# Patient Record
Sex: Male | Born: 2000 | Hispanic: No | Marital: Single | Smoking: Current some day smoker
Health system: Southern US, Community
[De-identification: ages and names within clinical notes are randomized; demographics above are authoritative.]

---
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
## 2021-07-13 ENCOUNTER — Encounter (HOSPITAL_COMMUNITY): Payer: Self-pay

## 2021-07-13 ENCOUNTER — Other Ambulatory Visit: Payer: Self-pay

## 2021-07-13 ENCOUNTER — Ambulatory Visit (INDEPENDENT_AMBULATORY_CARE_PROVIDER_SITE_OTHER): Payer: Self-pay

## 2021-07-13 ENCOUNTER — Ambulatory Visit (HOSPITAL_COMMUNITY)
Admission: EM | Admit: 2021-07-13 | Discharge: 2021-07-13 | Disposition: A | Discharge status: Home / Self Care | Payer: Self-pay | Attending: Physician Assistant | Admitting: Physician Assistant

## 2021-07-13 DIAGNOSIS — T148XXA Other injury of unspecified body region, initial encounter: Secondary | ICD-10-CM

## 2021-07-13 DIAGNOSIS — M25562 Pain in left knee: Secondary | ICD-10-CM

## 2021-07-13 DIAGNOSIS — Z23 Encounter for immunization: Secondary | ICD-10-CM

## 2021-07-13 DIAGNOSIS — S80212A Abrasion, left knee, initial encounter: Secondary | ICD-10-CM

## 2021-07-13 DIAGNOSIS — S8992XA Unspecified injury of left lower leg, initial encounter: Secondary | ICD-10-CM

## 2021-07-13 DIAGNOSIS — S8002XA Contusion of left knee, initial encounter: Secondary | ICD-10-CM

## 2021-07-13 DIAGNOSIS — S8702XA Crushing injury of left knee, initial encounter: Secondary | ICD-10-CM

## 2021-07-13 MED ORDER — TETANUS-DIPHTH-ACELL PERTUSSIS 5-2.5-18.5 LF-MCG/0.5 IM SUSY
PREFILLED_SYRINGE | INTRAMUSCULAR | Status: AC
  Filled 2021-07-13: qty 0.5

## 2021-07-13 MED ORDER — NAPROXEN 500 MG PO TABS: 500.0000 mg | ORAL_TABLET | Freq: Two times a day (BID) | ORAL | 0 refills | Status: DC

## 2021-07-13 MED ORDER — TETANUS-DIPHTH-ACELL PERTUSSIS 5-2.5-18.5 LF-MCG/0.5 IM SUSY
0.5000 mL | PREFILLED_SYRINGE | Freq: Once | INTRAMUSCULAR | Status: AC
  Administered 2021-07-13: 0.5 mL via INTRAMUSCULAR

## 2021-07-13 NOTE — ED Triage Notes (Signed)
Pt was working on a car when the lift broke and the car fell on his knee.  ?Last TDAP: Unknown ?

## 2021-07-13 NOTE — ED Provider Notes (Signed)
?[facility] ? ? ? ?CSN: [id number] ?Arrival date & time: 07/13/21  3299 ? ? ?  ? ?History   ?Chief Complaint ?Chief Complaint  ?Patient presents with  ? Knee Injury  ? ? ?HPI ?Edward Mcmillan is a 21 y.o. male.  ? ?Patient presents today following injury that occurred yesterday (07/12/2021).  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  Since that time he has had ongoing pain.  He reports pain is worse with certain movements but he is able to bear weight.  He denies any previous injury or surgery involving this knee.  He denies any numbness, paresthesias of the foot.  He does have a small abrasion on the lateral portion of his left knee is unsure when his last tetanus was. ? ? ?History reviewed. No pertinent past medical history. ? ?Patient Active Problem List  ? Diagnosis Date Noted  ? Enthesitis 06/02/2015  ? Obesity 06/02/2015  ? Acne 10/27/2014  ? Keratosis pilaris 03/11/2013  ? ? ?History reviewed. No pertinent surgical history. ? ? ? ? ?Home Medications   ? ?Prior to Admission medications   ?Medication Sig Start Date End Date Taking? Authorizing Provider  ?naproxen (NAPROSYN) 500 MG tablet Take 1 tablet (500 mg total) by mouth 2 (two) times daily with a meal. 07/13/21  Yes Mariya Mottley K, PA-C  ?azelaic acid (AZELEX) 20 % cream Apply topically daily. ?Patient not taking: Reported on 06/03/2017 06/02/15   Theadore Nan, MD  ? ? ?Family History ?Family History  ?Problem Relation Age of Onset  ? Healthy Mother   ? Healthy Father   ? ? ?Social History ?Social History  ? ?Tobacco Use  ? Smoking status: Some Days  ? Smokeless tobacco: Never  ?Substance Use Topics  ? Alcohol use: Not Currently  ? Drug use: Not Currently  ? ? ? ?Allergies   ?Patient has no known allergies. ? ? ?Review of Systems ?Review of Systems  ?Constitutional:  Positive for activity change. Negative for appetite change, fatigue and fever.  ?Musculoskeletal:  Positive for  arthralgias, gait problem and joint swelling. Negative for myalgias.  ?Neurological:  Negative for weakness and numbness.  ? ? ?Physical Exam ?Triage Vital Signs ?ED Triage Vitals [07/13/21 1048]  ?Enc Vitals Group  ?   BP 110/63  ?   Pulse Rate 72  ?   Resp 18  ?   Temp 98.4 ?F (36.9 ?C)  ?   Temp Source Oral  ?   SpO2 100 %  ?   Weight   ?   Height   ?   Head Circumference   ?   Peak Flow   ?   Pain Score   ?   Pain Loc   ?   Pain Edu?   ?   Excl. in GC?   ? ?No data found. ? ?Updated Vital Signs ?BP 110/63 (BP Location: Left Arm)   Pulse 72   Temp 98.4 ?F (36.9 ?C) (Oral)   Resp 18   SpO2 100%  ? ?Visual Acuity ?Right Eye Distance:   ?Left Eye Distance:   ?Bilateral Distance:   ? ?Right Eye Near:   ?Left Eye Near:    ?Bilateral Near:    ? ?Physical Exam ?Vitals reviewed.  ?Constitutional:   ?   General: He is awake.  ?   Appearance: Normal appearance. He is well-developed. He is not ill-appearing.  ?  Comments: Very pleasant male appears stated age in no acute distress sitting comfortably in exam room  ?HENT:  ?   Head: Normocephalic and atraumatic.  ?Cardiovascular:  ?   Rate and Rhythm: Normal rate and regular rhythm.  ?   Pulses:     ?     Posterior tibial pulses are 2+ on the left side.  ?   Heart sounds: Normal heart sounds, S1 normal and S2 normal. No murmur heard. ?Pulmonary:  ?   Effort: Pulmonary effort is normal.  ?   Breath sounds: Normal breath sounds. No stridor. No wheezing, rhonchi or rales.  ?   Comments: Clear to auscultation bilaterally ?Musculoskeletal:  ?   Left knee: Bony tenderness present. No swelling, deformity or effusion. Decreased range of motion. Tenderness present over the lateral joint line. No LCL laxity, MCL laxity, ACL laxity or PCL laxity. ?Skin: ?   Findings: Abrasion present.  ?   Comments: Small abrasion noted lateral left knee.  ?Neurological:  ?   Mental Status: He is alert.  ?Psychiatric:     ?   Behavior: Behavior is cooperative.  ? ? ? ?UC Treatments / Results   ?Labs ?(all labs ordered are listed, but only abnormal results are displayed) ?Labs Reviewed - No data to display ? ?EKG ? ? ?Radiology ?DG Knee Complete 4 Views Left ? ?Result Date: 07/13/2021 ?CLINICAL DATA:  Crush injury yesterday while working on a car, lift broke and car fell on his LEFT knee EXAM: LEFT KNEE - COMPLETE 4+ VIEW COMPARISON:  None FINDINGS: Osseous mineralization normal. Joint spaces preserved. No acute fracture, dislocation, or bone destruction. No joint effusion. IMPRESSION: No acute abnormalities. Electronically Signed   By: Ulyses Southward M.D.   On: 07/13/2021 11:15   ? ?Procedures ?Procedures (including critical care time) ? ?Medications Ordered in UC ?Medications  ?Tdap (BOOSTRIX) injection 0.5 mL (0.5 mLs Intramuscular Given 07/13/21 1105)  ? ? ?Initial Impression / Assessment and Plan / UC Course  ?I have reviewed the triage vital signs and the nursing notes. ? ?Pertinent labs & imaging results that were available during my care of the patient were reviewed by me and considered in my medical decision making (see chart for details). ? ?  ? ?X-ray obtained given mechanism of injury showed no osseous abnormality.  Discussed likely contusion as etiology of symptoms.  Patient was encouraged to use conservative treatment measures including RICE protocol.  He was placed in a brace for comfort and support.  Prescribed Naprosyn for pain relief with instruction to take additional NSAIDs with this medication due to risk of GI bleeding.  Can use Tylenol for breakthrough pain.  Abrasion appears to be healing appropriately.  Tetanus was updated today.  Recommend he keep this clean and return with any signs of infection.  If symptoms or not improving he is to follow-up with orthopedics and was given contact information for local provider.  Discussed alarm symptoms that warrant emergent evaluation.  Strict return precautions given to which he expressed understanding.  Work excuse note provided. ? ?Final  Clinical Impressions(s) / UC Diagnoses  ? ?Final diagnoses:  ?Contusion of left knee, initial encounter  ?Injury of left knee, initial encounter  ?Acute pain of left knee  ?Skin abrasion  ? ? ? ?Discharge Instructions   ? ?  ?Your x-ray was normal.  I believe that you have a contusion of your knee.  Please use the RICE protocol as we discussed.  This stands for rest, ice,  compression, elevation.  Take Naprosyn twice daily.  You should not take additional NSAIDs with this including aspirin, ibuprofen/Advil, naproxen/Aleve.  Use a brace for comfort and support.  Your tetanus was updated today.  Keep wound clean and if you have any signs of infection please return.  If your symptoms or not improving please follow-up with orthopedics; call to schedule an appointment.  If anything worsens suddenly please return for reevaluation. ? ? ? ? ?ED Prescriptions   ? ? Medication Sig Dispense Auth. Provider  ? naproxen (NAPROSYN) 500 MG tablet Take 1 tablet (500 mg total) by mouth 2 (two) times daily with a meal. 30 tablet Nikoli Nasser K, PA-C  ? ?  ? ?PDMP not reviewed this encounter. ?  ?Jeani Hawking, PA-C ?07/13/21 1132 ? ?

## 2021-07-13 NOTE — Discharge Instructions (Addendum)
Your x-ray was normal.  I believe that you have a contusion of your knee.  Please use the RICE protocol as we discussed.  This stands for rest, ice, compression, elevation.  Take Naprosyn twice daily.  You should not take additional NSAIDs with this including aspirin, ibuprofen/Advil, naproxen/Aleve.  Use a brace for comfort and support.  Your tetanus was updated today.  Keep wound clean and if you have any signs of infection please return.  If your symptoms or not improving please follow-up with orthopedics; call to schedule an appointment.  If anything worsens suddenly please return for reevaluation. ?

## 2021-12-24 ENCOUNTER — Encounter (HOSPITAL_COMMUNITY): Payer: Self-pay

## 2021-12-24 ENCOUNTER — Emergency Department (HOSPITAL_COMMUNITY)
Admission: EM | Admit: 2021-12-24 | Discharge: 2021-12-24 | Disposition: A | Discharge status: Home / Self Care | Payer: Self-pay | Attending: Emergency Medicine | Admitting: Emergency Medicine

## 2021-12-24 ENCOUNTER — Other Ambulatory Visit: Payer: Self-pay

## 2021-12-24 DIAGNOSIS — Z79899 Other long term (current) drug therapy: Secondary | ICD-10-CM | POA: Insufficient documentation

## 2021-12-24 DIAGNOSIS — F10920 Alcohol use, unspecified with intoxication, uncomplicated: Secondary | ICD-10-CM

## 2021-12-24 DIAGNOSIS — R519 Headache, unspecified: Secondary | ICD-10-CM | POA: Insufficient documentation

## 2021-12-24 DIAGNOSIS — Y906 Blood alcohol level of 120-199 mg/100 ml: Secondary | ICD-10-CM | POA: Insufficient documentation

## 2021-12-24 DIAGNOSIS — F10239 Alcohol dependence with withdrawal, unspecified: Secondary | ICD-10-CM | POA: Insufficient documentation

## 2021-12-24 DIAGNOSIS — F1210 Cannabis abuse, uncomplicated: Secondary | ICD-10-CM | POA: Insufficient documentation

## 2021-12-24 DIAGNOSIS — R400 Somnolence: Secondary | ICD-10-CM | POA: Insufficient documentation

## 2021-12-24 DIAGNOSIS — R4182 Altered mental status, unspecified: Secondary | ICD-10-CM | POA: Insufficient documentation

## 2021-12-24 LAB — URINALYSIS, ROUTINE W REFLEX MICROSCOPIC
Bilirubin Urine: NEGATIVE
Glucose, UA: NEGATIVE mg/dL
Ketones, ur: 5 mg/dL — AB
Leukocytes,Ua: NEGATIVE
Nitrite: NEGATIVE
Protein, ur: NEGATIVE mg/dL
Specific Gravity, Urine: 1.016 (ref 1.005–1.030)
pH: 5 (ref 5.0–8.0)

## 2021-12-24 LAB — COMPREHENSIVE METABOLIC PANEL
ALT: 28 U/L (ref 0–44)
AST: 23 U/L (ref 15–41)
Albumin: 4.9 g/dL (ref 3.5–5.0)
Alkaline Phosphatase: 78 U/L (ref 38–126)
Anion gap: 11 (ref 5–15)
BUN: 9 mg/dL (ref 6–20)
CO2: 22 mmol/L (ref 22–32)
Calcium: 9.3 mg/dL (ref 8.9–10.3)
Chloride: 112 mmol/L — ABNORMAL HIGH (ref 98–111)
Creatinine, Ser: 0.67 mg/dL (ref 0.61–1.24)
GFR, Estimated: >60 mL/min (ref >60)
Glucose, Bld: 100 mg/dL — ABNORMAL HIGH (ref 70–99)
Potassium: 3.6 mmol/L (ref 3.5–5.1)
Sodium: 145 mmol/L (ref 135–145)
Total Bilirubin: 0.5 mg/dL (ref 0.3–1.2)
Total Protein: 8.4 g/dL — ABNORMAL HIGH (ref 6.5–8.1)

## 2021-12-24 LAB — CBC WITH DIFFERENTIAL/PLATELET
Abs Immature Granulocytes: 0.02 10*3/uL (ref 0.00–0.07)
Basophils Absolute: 0 10*3/uL (ref 0.0–0.1)
Basophils Relative: 0 %
Eosinophils Absolute: 0 10*3/uL (ref 0.0–0.5)
Eosinophils Relative: 1 %
HCT: 44.9 % (ref 39.0–52.0)
Hemoglobin: 16.1 g/dL (ref 13.0–17.0)
Immature Granulocytes: 0 %
Lymphocytes Relative: 26 %
Lymphs Abs: 1.9 10*3/uL (ref 0.7–4.0)
MCH: 30.3 pg (ref 26.0–34.0)
MCHC: 35.9 g/dL (ref 30.0–36.0)
MCV: 84.6 fL (ref 80.0–100.0)
Monocytes Absolute: 0.6 10*3/uL (ref 0.1–1.0)
Monocytes Relative: 8 %
Neutro Abs: 4.9 10*3/uL (ref 1.7–7.7)
Neutrophils Relative %: 65 %
Platelets: 253 10*3/uL (ref 150–400)
RBC: 5.31 MIL/uL (ref 4.22–5.81)
RDW: 12.4 % (ref 11.5–15.5)
WBC: 7.5 10*3/uL (ref 4.0–10.5)
nRBC: 0 % (ref 0.0–0.2)

## 2021-12-24 LAB — RAPID URINE DRUG SCREEN, HOSP PERFORMED
Amphetamines: NOT DETECTED
Barbiturates: NOT DETECTED
Benzodiazepines: NOT DETECTED
Cocaine: NOT DETECTED
Opiates: NOT DETECTED
Tetrahydrocannabinol: POSITIVE — AB

## 2021-12-24 LAB — ETHANOL: Alcohol, Ethyl (B): 132 mg/dL — ABNORMAL HIGH (ref <10)

## 2021-12-24 LAB — CBG MONITORING, ED: Glucose-Capillary: 104 mg/dL — ABNORMAL HIGH (ref 70–99)

## 2021-12-24 MED ORDER — SODIUM CHLORIDE 0.9 % IV BOLUS
1000.0000 mL | Freq: Once | INTRAVENOUS | Status: AC
  Administered 2021-12-24: 1000 mL via INTRAVENOUS

## 2021-12-24 NOTE — ED Notes (Signed)
IV fluids running w/o complication. Pt a/o and w/o complaint. Family at bedside.
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
## 2022-06-25 ENCOUNTER — Ambulatory Visit (HOSPITAL_COMMUNITY)
Admission: EM | Admit: 2022-06-25 | Discharge: 2022-06-25 | Disposition: A | Discharge status: Home / Self Care | Payer: Self-pay | Attending: Internal Medicine | Admitting: Internal Medicine

## 2022-06-25 ENCOUNTER — Encounter (HOSPITAL_COMMUNITY): Payer: Self-pay

## 2022-06-25 DIAGNOSIS — R300 Dysuria: Secondary | ICD-10-CM

## 2022-06-25 LAB — POCT URINALYSIS DIPSTICK, ED / UC
Bilirubin Urine: NEGATIVE
Glucose, UA: NEGATIVE mg/dL
Hgb urine dipstick: NEGATIVE
Ketones, ur: NEGATIVE mg/dL
Leukocytes,Ua: NEGATIVE
Nitrite: NEGATIVE
Protein, ur: NEGATIVE mg/dL
Specific Gravity, Urine: 1.02 (ref 1.005–1.030)
Urobilinogen, UA: 1 mg/dL (ref 0.0–1.0)
pH: 7.5 (ref 5.0–8.0)

## 2022-06-25 MED ORDER — PHENAZOPYRIDINE HCL 200 MG PO TABS: 200.0000 mg | ORAL_TABLET | Freq: Three times a day (TID) | ORAL | 0 refills | Status: AC | PRN

## 2022-06-25 NOTE — ED Provider Notes (Signed)
[place]    CSN: [id number] Arrival date & time: 06/25/22  1018      History   Chief Complaint Chief Complaint  Patient presents with   Urinary Tract Infection    HPI Edward Mcmillan is a 22 y.o. male comes to the urgent care with 2-week history of dysuria.  Patient says symptoms have been persistent over the past couple of weeks.  No penile discharge.  No testicular pain or groin pain.  Patient describes the pain as burning on urination.  He denies any frequency or urgency.  No flank pain.  No blood in urine.  No fever or chills.  No constipation.  Patient is in a monogamous relationship.   HPI  History reviewed. No pertinent past medical history.  Patient Active Problem List   Diagnosis Date Noted   Enthesitis 06/02/2015   Obesity 06/02/2015   Acne 10/27/2014   Keratosis pilaris 03/11/2013    History reviewed. No pertinent surgical history.     Home Medications    Prior to Admission medications   Medication Sig Start Date End Date Taking? Authorizing Provider  phenazopyridine (PYRIDIUM) 200 MG tablet Take 1 tablet (200 mg total) by mouth 3 (three) times daily as needed for pain. 06/25/22  Yes Lyal Husted, Myrene Galas, MD    Family History Family History  Problem Relation Age of Onset   Healthy Mother    Healthy Father     Social History Social History   Tobacco Use   Smoking status: Some Days   Smokeless tobacco: Never  Substance Use Topics   Alcohol use: Not Currently   Drug use: Not Currently     Allergies   Patient has no known allergies.   Review of Systems Review of Systems As per HPI  Physical Exam Triage Vital Signs ED Triage Vitals  Enc Vitals Group     BP 06/25/22 1208 (!) 144/82     Pulse Rate 06/25/22 1208 82     Resp 06/25/22 1208 18     Temp 06/25/22 1208 98.7 F (37.1 C)     Temp Source 06/25/22 1208 Oral     SpO2 06/25/22 1208 99 %     Weight --      Height --      Head Circumference --      Peak Flow --       Pain Score 06/25/22 1212 5     Pain Loc --      Pain Edu? --      Excl. in [place]? --    No data found.  Updated Vital Signs BP (!) 144/82 (BP Location: Left Arm)   Pulse 82   Temp 98.7 F (37.1 C) (Oral)   Resp 18   SpO2 99%   Visual Acuity Right Eye Distance:   Left Eye Distance:   Bilateral Distance:    Right Eye Near:   Left Eye Near:    Bilateral Near:     Physical Exam Vitals and nursing note reviewed.  Constitutional:      General: He is not in acute distress.    Appearance: He is not ill-appearing.  Cardiovascular:     Rate and Rhythm: Normal rate and regular rhythm.  Pulmonary:     Effort: Pulmonary effort is normal.     Breath sounds: Normal breath sounds.  Abdominal:     General: Bowel sounds are normal.     Palpations: Abdomen is soft.  Neurological:     Mental Status:  He is alert.      UC Treatments / Results  Labs (all labs ordered are listed, but only abnormal results are displayed) Labs Reviewed  POCT URINALYSIS DIPSTICK, ED / UC    EKG   Radiology No results found.  Procedures Procedures (including critical care time)  Medications Ordered in UC Medications - No data to display  Initial Impression / Assessment and Plan / UC Course  I have reviewed the triage vital signs and the nursing notes.  Pertinent labs & imaging results that were available during my care of the patient were reviewed by me and considered in my medical decision making (see chart for details).     1.  Dysuria: Point-of-care urinalysis is negative for UTI Patient is encouraged to increase oral fluid intake Increase fiber intake. Pyridium as needed for dysuria Return precautions given. Final Clinical Impressions(s) / UC Diagnoses   Final diagnoses:  Dysuria     Discharge Instructions      Please increase oral fluid intake Urine analysis is negative for urinary tract infection Increase fiber intake If symptoms persist or worsens please return to  urgent care to be reevaluated.   ED Prescriptions     Medication Sig Dispense Auth. Provider   phenazopyridine (PYRIDIUM) 200 MG tablet Take 1 tablet (200 mg total) by mouth 3 (three) times daily as needed for pain. 6 tablet Tanith Dagostino, Myrene Galas, MD      PDMP not reviewed this encounter.   Chase Picket, MD 06/25/22 [phone]

## 2022-06-25 NOTE — Discharge Instructions (Signed)
Please increase oral fluid intake Urine analysis is negative for urinary tract infection Increase fiber intake If symptoms persist or worsens please return to urgent care to be reevaluated.

## 2022-06-25 NOTE — ED Triage Notes (Signed)
Pt states 2 weeks h/o dysuria . Denies any penile discharge at this time.

## 2023-07-07 IMAGING — DX DG KNEE COMPLETE 4+V*L*
4 series · 4 of 4 positions shown · non-contrast
Comparison: None

CLINICAL DATA: Crush injury yesterday while working on a car, lift
broke and car fell on his LEFT knee

EXAM:
LEFT KNEE - COMPLETE 4+ VIEW

[knee ap]
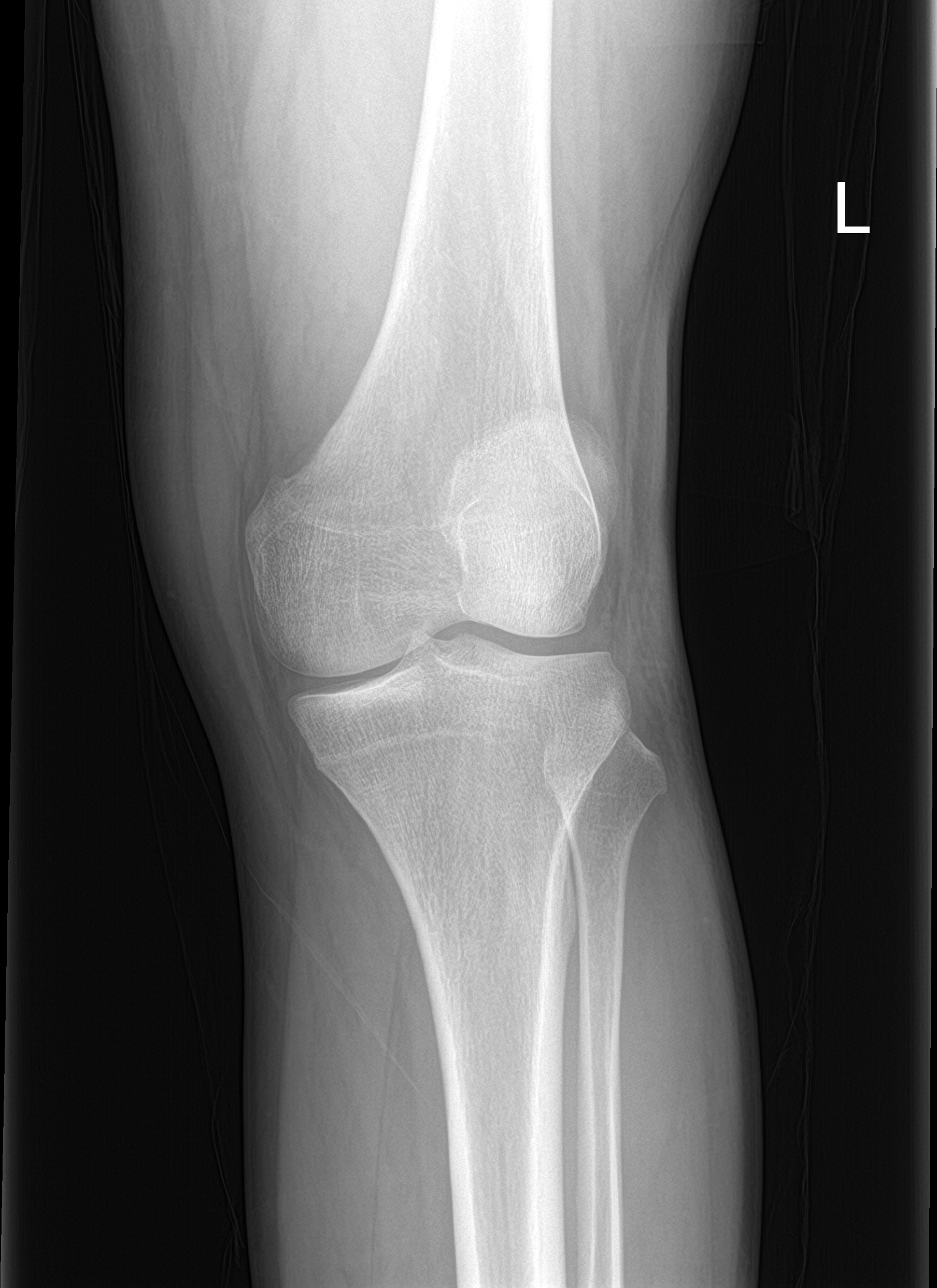

[knee obl (1 of 2)]
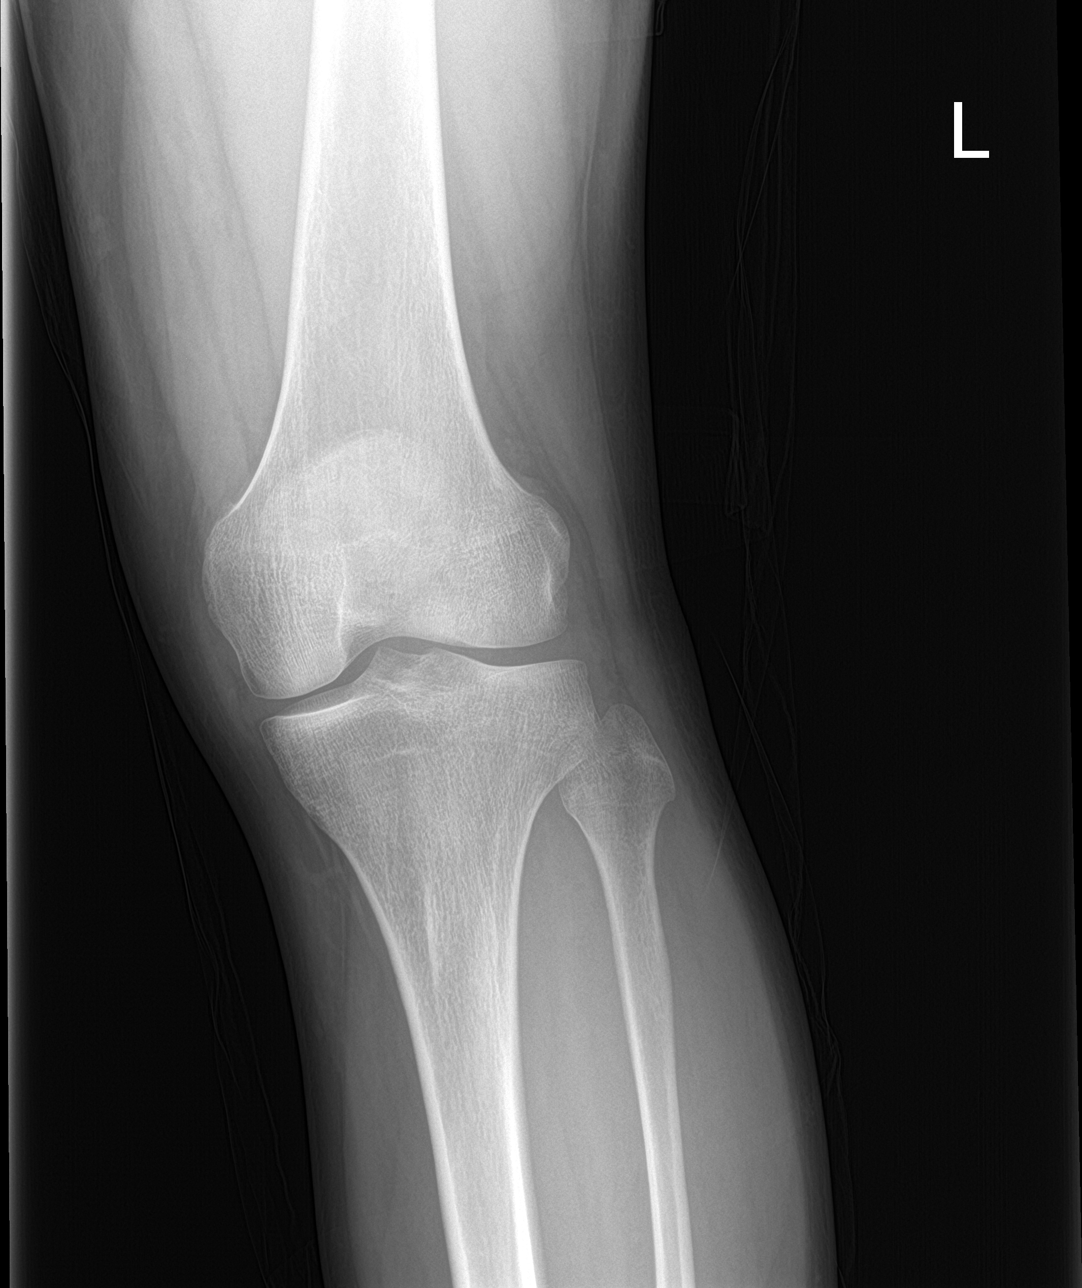

[knee obl (2 of 2)]
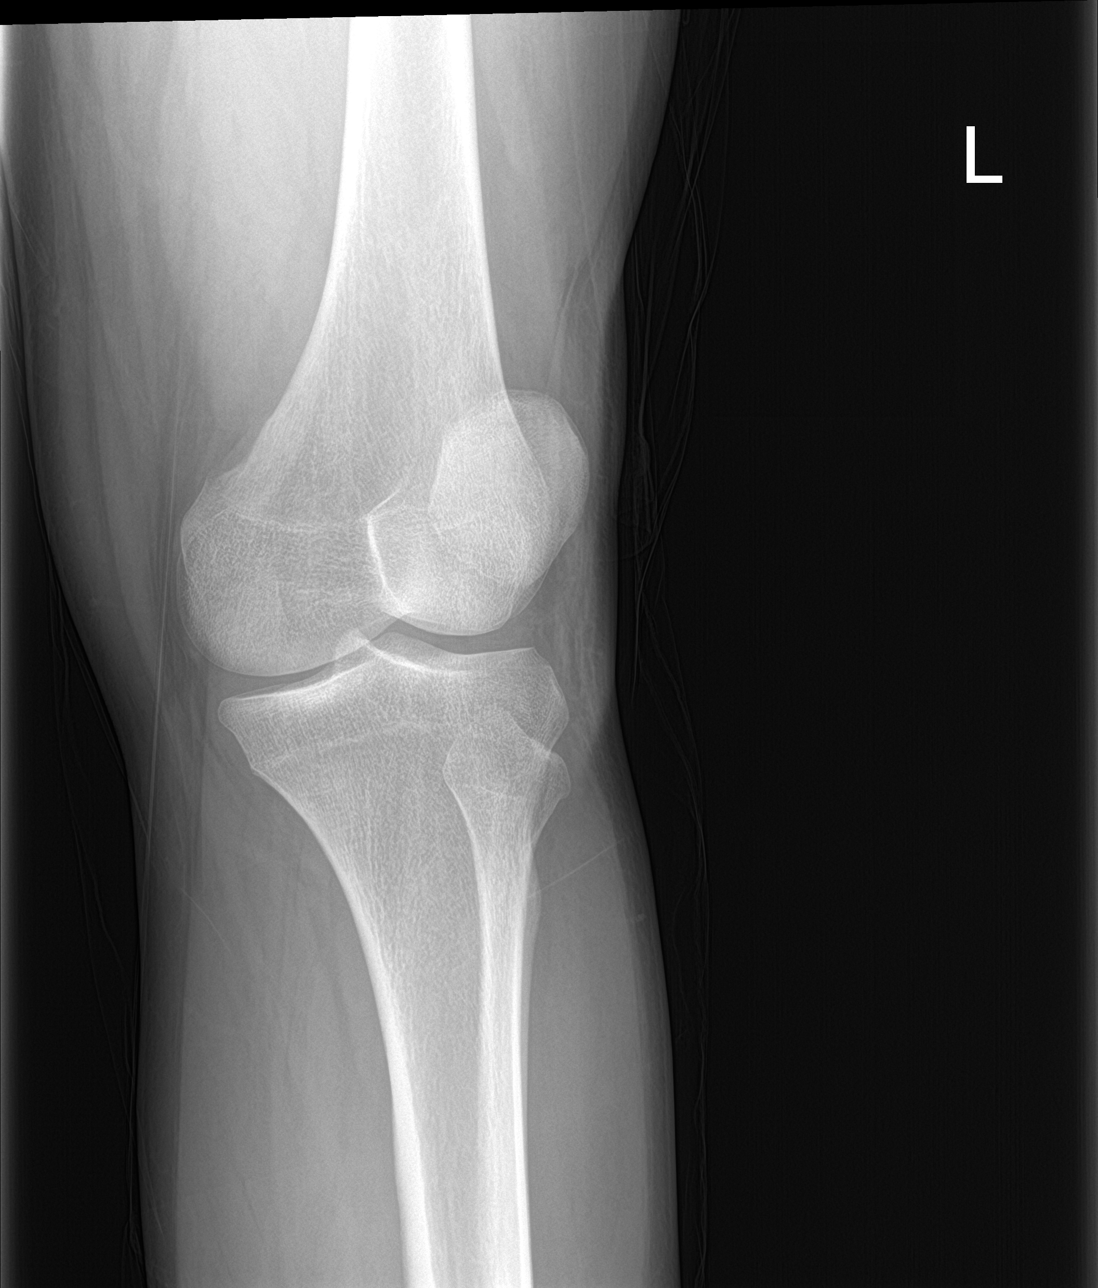

[knee lat]
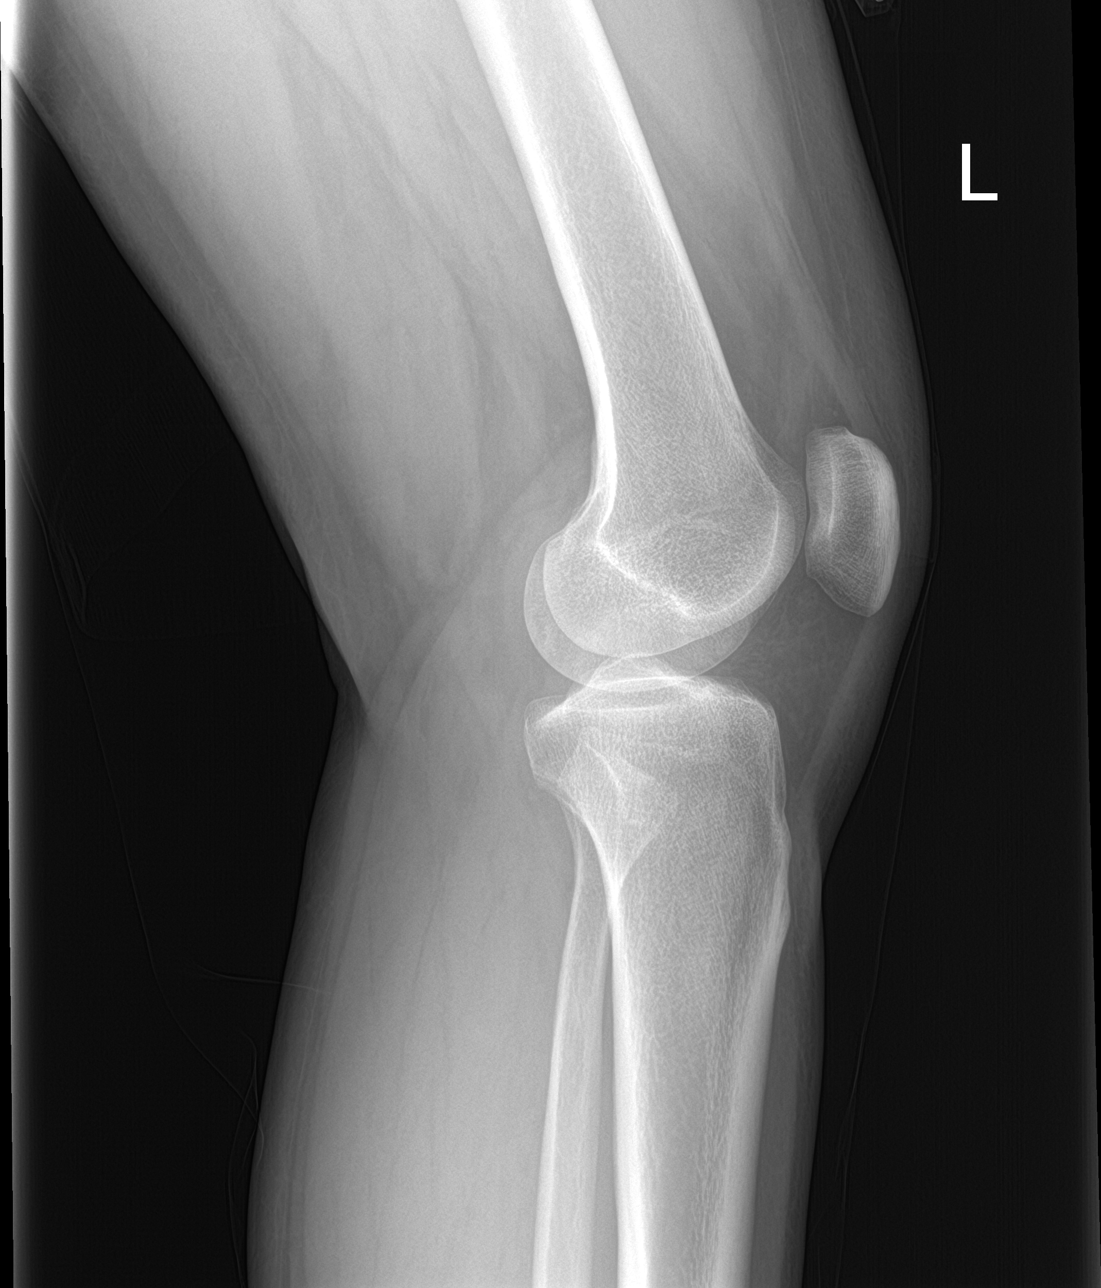

[4 of 4 positions shown; findings below may reference images not displayed]

FINDINGS: Osseous mineralization normal.

Joint spaces preserved.

No acute fracture, dislocation, or bone destruction.

No joint effusion.
IMPRESSION: No acute abnormalities.
